# Patient Record
Sex: Female | Born: 1945 | Hispanic: No | Marital: Married | Smoking: Never smoker
Health system: Southern US, Community
[De-identification: ages and names within clinical notes are randomized; demographics above are authoritative.]

## PROBLEM LIST (undated history)

## (undated) DIAGNOSIS — E7800 Pure hypercholesterolemia, unspecified: Secondary | ICD-10-CM

## (undated) DIAGNOSIS — I10 Essential (primary) hypertension: Secondary | ICD-10-CM

## (undated) DIAGNOSIS — E119 Type 2 diabetes mellitus without complications: Secondary | ICD-10-CM

---

## 2008-05-08 ENCOUNTER — Ambulatory Visit (HOSPITAL_COMMUNITY): Admission: RE | Admit: 2008-05-08 | Discharge: 2008-05-08 | Payer: Self-pay | Admitting: Family Medicine

## 2010-05-15 ENCOUNTER — Ambulatory Visit (HOSPITAL_COMMUNITY): Admission: RE | Admit: 2010-05-15 | Discharge: 2010-05-15 | Payer: Self-pay | Admitting: Family Medicine

## 2010-08-29 ENCOUNTER — Encounter: Payer: Self-pay | Admitting: Family Medicine

## 2012-03-30 ENCOUNTER — Other Ambulatory Visit (HOSPITAL_COMMUNITY): Payer: Self-pay | Admitting: Family Medicine

## 2012-03-30 DIAGNOSIS — Z1231 Encounter for screening mammogram for malignant neoplasm of breast: Secondary | ICD-10-CM

## 2012-04-13 ENCOUNTER — Ambulatory Visit (HOSPITAL_COMMUNITY)
Admission: RE | Admit: 2012-04-13 | Discharge: 2012-04-13 | Disposition: A | Discharge status: Home / Self Care | Payer: BC Managed Care – PPO | Source: Ambulatory Visit | Attending: Family Medicine | Admitting: Family Medicine

## 2012-04-13 DIAGNOSIS — Z1231 Encounter for screening mammogram for malignant neoplasm of breast: Secondary | ICD-10-CM | POA: Insufficient documentation

## 2013-07-04 ENCOUNTER — Other Ambulatory Visit (HOSPITAL_COMMUNITY): Payer: Self-pay | Admitting: Family Medicine

## 2013-07-04 DIAGNOSIS — Z1231 Encounter for screening mammogram for malignant neoplasm of breast: Secondary | ICD-10-CM

## 2013-07-06 ENCOUNTER — Ambulatory Visit (HOSPITAL_COMMUNITY): Payer: BC Managed Care – PPO

## 2013-07-20 ENCOUNTER — Ambulatory Visit (HOSPITAL_COMMUNITY)
Admission: RE | Admit: 2013-07-20 | Discharge: 2013-07-20 | Disposition: A | Discharge status: Home / Self Care | Payer: No Typology Code available for payment source | Source: Ambulatory Visit | Attending: Family Medicine | Admitting: Family Medicine

## 2013-07-20 DIAGNOSIS — Z1231 Encounter for screening mammogram for malignant neoplasm of breast: Secondary | ICD-10-CM | POA: Insufficient documentation

## 2014-11-29 ENCOUNTER — Other Ambulatory Visit (HOSPITAL_COMMUNITY): Payer: Self-pay | Admitting: Family Medicine

## 2014-11-29 DIAGNOSIS — Z1231 Encounter for screening mammogram for malignant neoplasm of breast: Secondary | ICD-10-CM

## 2014-12-06 ENCOUNTER — Ambulatory Visit (HOSPITAL_COMMUNITY)
Admission: RE | Admit: 2014-12-06 | Discharge: 2014-12-06 | Disposition: A | Discharge status: Home / Self Care | Payer: No Typology Code available for payment source | Source: Ambulatory Visit | Attending: Family Medicine | Admitting: Family Medicine

## 2014-12-06 DIAGNOSIS — Z1231 Encounter for screening mammogram for malignant neoplasm of breast: Secondary | ICD-10-CM

## 2016-03-22 ENCOUNTER — Other Ambulatory Visit: Payer: Self-pay | Admitting: Family Medicine

## 2016-03-23 LAB — CMP12+LP+TP+TSH+6AC+CBC/D/PLT
A/G RATIO: 1.6 (ref 1.2–2.2)
ALBUMIN: 4.6 g/dL (ref 3.5–4.8)
ALK PHOS: 68 IU/L (ref 39–117)
ALT: 21 IU/L (ref 0–32)
AST: 20 IU/L (ref 0–40)
BASOS ABS: 0 10*3/uL (ref 0.0–0.2)
BASOS: 0 %
BILIRUBIN TOTAL: 0.4 mg/dL (ref 0.0–1.2)
BUN / CREAT RATIO: 40 — AB (ref 12–28)
BUN: 23 mg/dL (ref 8–27)
CHLORIDE: 98 mmol/L (ref 96–106)
CHOLESTEROL TOTAL: 149 mg/dL (ref 100–199)
Calcium: 9.8 mg/dL (ref 8.7–10.3)
Chol/HDL Ratio: 2.6 ratio units (ref 0.0–4.4)
Creatinine, Ser: 0.57 mg/dL (ref 0.57–1.00)
EOS (ABSOLUTE): 0.2 10*3/uL (ref 0.0–0.4)
EOS: 2 %
FREE THYROXINE INDEX: 1.7 (ref 1.2–4.9)
GFR calc non Af Amer: 94 mL/min/{1.73_m2} (ref >59)
GFR, EST AFRICAN AMERICAN: 109 mL/min/{1.73_m2} (ref >59)
GGT: 37 IU/L (ref 0–60)
GLUCOSE: 119 mg/dL — AB (ref 65–99)
Globulin, Total: 2.9 g/dL (ref 1.5–4.5)
HDL: 57 mg/dL (ref >39)
HEMATOCRIT: 42.9 % (ref 34.0–46.6)
HEMOGLOBIN: 14.1 g/dL (ref 11.1–15.9)
IMMATURE GRANS (ABS): 0 10*3/uL (ref 0.0–0.1)
IMMATURE GRANULOCYTES: 0 %
Iron: 87 ug/dL (ref 27–139)
LDH: 214 IU/L (ref 119–226)
LDL CALC: 62 mg/dL (ref 0–99)
LYMPHS: 26 %
Lymphocytes Absolute: 2.5 10*3/uL (ref 0.7–3.1)
MCH: 31.9 pg (ref 26.6–33.0)
MCHC: 32.9 g/dL (ref 31.5–35.7)
MCV: 97 fL (ref 79–97)
MONOCYTES: 7 %
Monocytes Absolute: 0.7 10*3/uL (ref 0.1–0.9)
NEUTROS PCT: 65 %
Neutrophils Absolute: 6.2 10*3/uL (ref 1.4–7.0)
PLATELETS: 252 10*3/uL (ref 150–379)
Phosphorus: 3.9 mg/dL (ref 2.5–4.5)
Potassium: 3.8 mmol/L (ref 3.5–5.2)
RBC: 4.42 x10E6/uL (ref 3.77–5.28)
RDW: 13.6 % (ref 12.3–15.4)
SODIUM: 142 mmol/L (ref 134–144)
T3 Uptake Ratio: 23 % — ABNORMAL LOW (ref 24–39)
T4, Total: 7.3 ug/dL (ref 4.5–12.0)
TSH: 1.52 u[IU]/mL (ref 0.450–4.500)
Total Protein: 7.5 g/dL (ref 6.0–8.5)
Triglycerides: 149 mg/dL (ref 0–149)
Uric Acid: 4.3 mg/dL (ref 2.5–7.1)
VLDL CHOLESTEROL CAL: 30 mg/dL (ref 5–40)
WBC: 9.6 10*3/uL (ref 3.4–10.8)

## 2016-03-23 LAB — HGB A1C W/O EAG: Hgb A1c MFr Bld: 6.6 % — ABNORMAL HIGH (ref 4.8–5.6)

## 2016-06-08 ENCOUNTER — Encounter: Payer: Self-pay | Admitting: *Deleted

## 2016-11-04 ENCOUNTER — Other Ambulatory Visit: Payer: Self-pay | Admitting: Family Medicine

## 2016-11-04 DIAGNOSIS — Z1231 Encounter for screening mammogram for malignant neoplasm of breast: Secondary | ICD-10-CM

## 2016-11-26 ENCOUNTER — Ambulatory Visit
Admission: RE | Admit: 2016-11-26 | Discharge: 2016-11-26 | Disposition: A | Discharge status: Home / Self Care | Payer: No Typology Code available for payment source | Source: Ambulatory Visit | Attending: Family Medicine | Admitting: Family Medicine

## 2016-11-26 DIAGNOSIS — Z1231 Encounter for screening mammogram for malignant neoplasm of breast: Secondary | ICD-10-CM

## 2017-04-29 ENCOUNTER — Ambulatory Visit: Payer: Self-pay | Admitting: *Deleted

## 2017-04-29 VITALS — BP 132/81 | Ht 62.0 in | Wt 170.0 lb

## 2017-04-29 DIAGNOSIS — Z0000 Encounter for general adult medical examination without abnormal findings: Secondary | ICD-10-CM

## 2017-04-29 NOTE — Progress Notes (Signed)
[employer] insurance premium discount evaluation: Labs Drawn. Replacements ROI form signed. Tobacco Free Attestation form signed.  Forms placed in paper chart.  Okay to route results to pcp per pt.   Pt requests I call a coworker on Monday to help translate for her and spouses' results as their daughter normally translates for them but works all day.

## 2017-04-30 LAB — CMP12+LP+TP+TSH+6AC+CBC/D/PLT
ALK PHOS: 79 IU/L (ref 39–117)
ALT: 30 IU/L (ref 0–32)
AST: 26 IU/L (ref 0–40)
Albumin/Globulin Ratio: 1.7 (ref 1.2–2.2)
Albumin: 4.6 g/dL (ref 3.5–4.8)
BASOS: 0 %
BUN/Creatinine Ratio: 34 — ABNORMAL HIGH (ref 12–28)
BUN: 22 mg/dL (ref 8–27)
Basophils Absolute: 0 10*3/uL (ref 0.0–0.2)
Bilirubin Total: 0.7 mg/dL (ref 0.0–1.2)
CALCIUM: 9.9 mg/dL (ref 8.7–10.3)
CHLORIDE: 100 mmol/L (ref 96–106)
CHOL/HDL RATIO: 2.9 ratio (ref 0.0–4.4)
CREATININE: 0.64 mg/dL (ref 0.57–1.00)
Cholesterol, Total: 135 mg/dL (ref 100–199)
EOS (ABSOLUTE): 0.3 10*3/uL (ref 0.0–0.4)
Eos: 4 %
Estimated CHD Risk: <0.5 times avg. (ref 0.0–1.0)
Free Thyroxine Index: 1.8 (ref 1.2–4.9)
GFR calc Af Amer: 104 mL/min/{1.73_m2} (ref >59)
GFR, EST NON AFRICAN AMERICAN: 90 mL/min/{1.73_m2} (ref >59)
GGT: 26 IU/L (ref 0–60)
GLOBULIN, TOTAL: 2.7 g/dL (ref 1.5–4.5)
GLUCOSE: 125 mg/dL — AB (ref 65–99)
HDL: 47 mg/dL (ref >39)
HEMATOCRIT: 42.1 % (ref 34.0–46.6)
Hemoglobin: 14.3 g/dL (ref 11.1–15.9)
IMMATURE GRANULOCYTES: 0 %
Immature Grans (Abs): 0 10*3/uL (ref 0.0–0.1)
Iron: 83 ug/dL (ref 27–139)
LDH: 204 IU/L (ref 119–226)
LDL CALC: 59 mg/dL (ref 0–99)
LYMPHS: 32 %
Lymphocytes Absolute: 2.4 10*3/uL (ref 0.7–3.1)
MCH: 32.6 pg (ref 26.6–33.0)
MCHC: 34 g/dL (ref 31.5–35.7)
MCV: 96 fL (ref 79–97)
MONOCYTES: 8 %
MONOS ABS: 0.6 10*3/uL (ref 0.1–0.9)
NEUTROS ABS: 4.3 10*3/uL (ref 1.4–7.0)
Neutrophils: 56 %
PHOSPHORUS: 3.8 mg/dL (ref 2.5–4.5)
POTASSIUM: 4.1 mmol/L (ref 3.5–5.2)
Platelets: 247 10*3/uL (ref 150–379)
RBC: 4.39 x10E6/uL (ref 3.77–5.28)
RDW: 13.5 % (ref 12.3–15.4)
SODIUM: 140 mmol/L (ref 134–144)
T3 Uptake Ratio: 24 % (ref 24–39)
T4, Total: 7.3 ug/dL (ref 4.5–12.0)
TRIGLYCERIDES: 147 mg/dL (ref 0–149)
TSH: 1.66 u[IU]/mL (ref 0.450–4.500)
Total Protein: 7.3 g/dL (ref 6.0–8.5)
URIC ACID: 7 mg/dL (ref 2.5–7.1)
VLDL Cholesterol Cal: 29 mg/dL (ref 5–40)
WBC: 7.7 10*3/uL (ref 3.4–10.8)

## 2017-04-30 LAB — HGB A1C W/O EAG: HEMOGLOBIN A1C: 6.5 % — AB (ref 4.8–5.6)

## 2017-05-09 NOTE — Progress Notes (Signed)
Results reviewed with pt. Glucose and A1c elevated, similar to previous. Diet and exercise recommendations for wt management (BMI 31) and A1c discussed. Routine f/u with pcp. Copy of labs provided to pt. Results routed to pcp per pt request. No further questions concerns.

## 2017-10-12 ENCOUNTER — Other Ambulatory Visit: Payer: Self-pay | Admitting: Family Medicine

## 2017-10-12 DIAGNOSIS — M810 Age-related osteoporosis without current pathological fracture: Secondary | ICD-10-CM

## 2018-01-19 ENCOUNTER — Other Ambulatory Visit: Payer: Self-pay | Admitting: Family Medicine

## 2018-01-19 DIAGNOSIS — E2839 Other primary ovarian failure: Secondary | ICD-10-CM

## 2018-02-24 ENCOUNTER — Other Ambulatory Visit: Payer: Self-pay | Admitting: Family Medicine

## 2018-02-24 DIAGNOSIS — Z1231 Encounter for screening mammogram for malignant neoplasm of breast: Secondary | ICD-10-CM

## 2018-04-11 ENCOUNTER — Ambulatory Visit
Admission: RE | Admit: 2018-04-11 | Discharge: 2018-04-11 | Disposition: A | Discharge status: Home / Self Care | Payer: No Typology Code available for payment source | Source: Ambulatory Visit | Attending: Family Medicine | Admitting: Family Medicine

## 2018-04-11 ENCOUNTER — Other Ambulatory Visit: Payer: No Typology Code available for payment source

## 2018-04-11 DIAGNOSIS — Z1231 Encounter for screening mammogram for malignant neoplasm of breast: Secondary | ICD-10-CM

## 2018-06-13 ENCOUNTER — Ambulatory Visit
Admission: RE | Admit: 2018-06-13 | Discharge: 2018-06-13 | Disposition: A | Discharge status: Home / Self Care | Payer: No Typology Code available for payment source | Source: Ambulatory Visit | Attending: Family Medicine | Admitting: Family Medicine

## 2018-06-13 DIAGNOSIS — E2839 Other primary ovarian failure: Secondary | ICD-10-CM

## 2018-08-26 ENCOUNTER — Encounter (HOSPITAL_BASED_OUTPATIENT_CLINIC_OR_DEPARTMENT_OTHER): Payer: Self-pay | Admitting: Emergency Medicine

## 2018-08-26 ENCOUNTER — Other Ambulatory Visit: Payer: Self-pay

## 2018-08-26 ENCOUNTER — Emergency Department (HOSPITAL_BASED_OUTPATIENT_CLINIC_OR_DEPARTMENT_OTHER)
Admission: EM | Admit: 2018-08-26 | Discharge: 2018-08-26 | Disposition: A | Discharge status: Home / Self Care | Payer: No Typology Code available for payment source | Attending: Emergency Medicine | Admitting: Emergency Medicine

## 2018-08-26 DIAGNOSIS — I10 Essential (primary) hypertension: Secondary | ICD-10-CM | POA: Diagnosis not present

## 2018-08-26 DIAGNOSIS — Z7984 Long term (current) use of oral hypoglycemic drugs: Secondary | ICD-10-CM | POA: Diagnosis not present

## 2018-08-26 DIAGNOSIS — E119 Type 2 diabetes mellitus without complications: Secondary | ICD-10-CM | POA: Diagnosis not present

## 2018-08-26 DIAGNOSIS — T7840XA Allergy, unspecified, initial encounter: Secondary | ICD-10-CM | POA: Insufficient documentation

## 2018-08-26 DIAGNOSIS — R220 Localized swelling, mass and lump, head: Secondary | ICD-10-CM | POA: Diagnosis present

## 2018-08-26 HISTORY — DX: Essential (primary) hypertension: I10

## 2018-08-26 HISTORY — DX: Type 2 diabetes mellitus without complications: E11.9

## 2018-08-26 HISTORY — DX: Pure hypercholesterolemia, unspecified: E78.00

## 2018-08-26 MED ORDER — DIPHENHYDRAMINE HCL 50 MG/ML IJ SOLN
25.0000 mg | Freq: Once | INTRAMUSCULAR | Status: AC
  Administered 2018-08-26: 25 mg via INTRAVENOUS
  Filled 2018-08-26: qty 1

## 2018-08-26 MED ORDER — SODIUM CHLORIDE 0.9 % IV SOLN
INTRAVENOUS | Status: DC | PRN
Start: 2018-08-26 — End: 2018-08-27
  Administered 2018-08-26: 250 mL via INTRAVENOUS

## 2018-08-26 MED ORDER — EPINEPHRINE 0.3 MG/0.3ML IJ SOAJ: 0.3000 mg | Freq: Once | INTRAMUSCULAR | 1 refills | Status: AC

## 2018-08-26 MED ORDER — PREDNISONE 20 MG PO TABS: 40.0000 mg | ORAL_TABLET | Freq: Every day | ORAL | 0 refills | Status: AC

## 2018-08-26 MED ORDER — FAMOTIDINE 20 MG PO TABS: 20.0000 mg | ORAL_TABLET | Freq: Two times a day (BID) | ORAL | 0 refills | Status: AC

## 2018-08-26 MED ORDER — FAMOTIDINE IN NACL 20-0.9 MG/50ML-% IV SOLN
20.0000 mg | Freq: Once | INTRAVENOUS | Status: AC
  Administered 2018-08-26: 20 mg via INTRAVENOUS
  Filled 2018-08-26: qty 50

## 2018-08-26 MED ORDER — DIPHENHYDRAMINE HCL 25 MG PO TABS: 25.0000 mg | ORAL_TABLET | Freq: Four times a day (QID) | ORAL | 0 refills | Status: AC | PRN

## 2018-08-26 MED ORDER — METHYLPREDNISOLONE SODIUM SUCC 125 MG IJ SOLR
125.0000 mg | Freq: Once | INTRAMUSCULAR | Status: AC
  Administered 2018-08-26: 125 mg via INTRAVENOUS
  Filled 2018-08-26: qty 2

## 2018-08-26 NOTE — ED Notes (Signed)
Rx x 4 given. Pt d/c home with daughter. D/c instructions reviewed with audio interpreter [phone]

## 2018-08-26 NOTE — ED Triage Notes (Signed)
Problem with with food allergies, the patient states that she ate coleslaw for lunch and since she has had trouble with her throat and her eyes are itching. She also had throwing up after she ate the coleslaw. Patient is is not noted distress at this time

## 2018-08-26 NOTE — Discharge Instructions (Signed)
Please read and follow all provided instructions.  Your diagnoses today include:  1. Allergic reaction, initial encounter     Tests performed today include:  Vital signs. See below for your results today.   Medications prescribed:   Prednisone - steroid medicine   It is best to take this medication in the morning to prevent sleeping problems. If you are diabetic, monitor your blood sugar closely and stop taking Prednisone if blood sugar is over 300. Take with food to prevent stomach upset.    Benadryl (diphenhydramine) - antihistamine  You can find this medication over-the-counter.   DO NOT exceed:   50mg  Benadryl every 6 hours    Benadryl will make you drowsy. DO NOT drive or perform any activities that require you to be awake and alert if taking this.   Pepcid (famotidine) - antihistamine  You can find this medication over-the-counter.   DO NOT exceed:   20mg  Pepcid every 12 hours   Epi-pen  Inject into thigh as directed if you have a severe reaction that causes throat swelling or any trouble breathing. Call 9-1-1 immediately if you use an Epi-pen. You should be evaluated at a hospital as soon as possible.    Take any prescribed medications only as directed.  Home care instructions:   Follow any educational materials contained in this packet  Follow-up instructions: Please follow-up with your primary care provider in the next 3 days for further evaluation of your symptoms.   Return instructions:   Please return to the Emergency Department if you experience worsening symptoms.   Call 9-1-1 immediately if you have an allergic reaction that involves your lips, mouth, throat or if you have any difficulty breathing. This is a life-threatening emergency.   Please return if you have any other emergent concerns.  Additional Information:  Your vital signs today were: BP 134/66    Pulse 71    Temp 97.7 F (36.5 C) (Oral)    Resp 16    Ht 5' (1.524 m)    Wt 78.9  kg    SpO2 98%    BMI 33.98 kg/m  If your blood pressure (BP) was elevated above 135/85 this visit, please have this repeated by your doctor within one month. --------------

## 2018-08-26 NOTE — ED Provider Notes (Addendum)
[facility] EMERGENCY DEPARTMENT Provider Note   CSN: [id number] Arrival date & time: 08/26/18  1809     History   Chief Complaint No chief complaint on file.   HPI Abigail Barnes is a 73 y.o. female.  Patient with history of diabetes presents the emergency department with complaint of allergic reaction.  History obtained via daughter who interprets as well as [place] interpreter.  Patient had coleslaw at approximately 3 PM.  Around 3:30pm she developed swelling in her face, itchy hands, and difficulty swallowing.  Patient had an episode of vomiting.  No diarrhea or abdominal pain.  Later she took some Benadryl.  Symptoms have stabilized however have not resolved.  She has not had any difficulty breathing.  She denies previous food or medication allergies.  No history of similar reactions in the past. The onset of this condition was acute. The course is constant. Aggravating factors: none. Alleviating factors: none.       Past Medical History:  Diagnosis Date  . Diabetes mellitus without complication (HCC)   . Hypercholesterolemia   . Hypertension     There are no active problems to display for this patient.   History reviewed. No pertinent surgical history.   OB History   No obstetric history on file.      Home Medications    Prior to Admission medications   Medication Sig Start Date End Date Taking? Authorizing Provider  amlodipine-atorvastatin (CADUET) 10-20 MG tablet TAKE 1 TABLET BY MOUTH DAILY 02/23/17   [provider]  aspirin 81 MG chewable tablet Chew 81 mg by mouth. 03/16/13   [provider]  losartan-hydrochlorothiazide (HYZAAR) 100-12.5 MG tablet TAKE 1 TABLET BY MOUTH EVERY DAY 04/05/17   [provider]  metFORMIN (GLUCOPHAGE-XR) 500 MG 24 hr tablet TAKE 1 TABLET(500 MG) BY MOUTH DAILY 07/29/16   [provider]    Family History History reviewed. No pertinent family history.  Social History Social History    Tobacco Use  . Smoking status: Never Smoker  . Smokeless tobacco: Never Used  Substance Use Topics  . Alcohol use: Never    Frequency: Never  . Drug use: Never     Allergies   Patient has no known allergies.   Review of Systems Review of Systems  Constitutional: Negative for fever.  HENT: Positive for facial swelling and trouble swallowing.   Eyes: Negative for redness.  Respiratory: Negative for shortness of breath, wheezing and stridor.   Cardiovascular: Negative for chest pain.  Gastrointestinal: Negative for nausea and vomiting.  Musculoskeletal: Negative for myalgias.  Skin: Positive for rash.  Neurological: Negative for light-headedness.  Psychiatric/Behavioral: Negative for confusion.     Physical Exam Updated Vital Signs BP (!) 147/70 (BP Location: Left Arm)   Pulse 77   Temp 97.7 F (36.5 C) (Oral)   Resp 16   Ht 5' (1.524 m)   Wt 78.9 kg   SpO2 98%   BMI 33.98 kg/m   Physical Exam Vitals signs and nursing note reviewed.  Constitutional:      Appearance: She is well-developed.  HENT:     Head: Normocephalic and atraumatic.     Mouth/Throat:     Comments: No lip or tongue swelling.  Mild posterior pharyngeal edema.  Patient in no respiratory distress.  No stridor. Eyes:     General:        Right eye: No discharge.        Left eye: No discharge.  Conjunctiva/sclera: Conjunctivae normal.  Neck:     Musculoskeletal: Normal range of motion and neck supple.  Cardiovascular:     Rate and Rhythm: Normal rate and regular rhythm.     Heart sounds: Normal heart sounds.  Pulmonary:     Effort: Pulmonary effort is normal.     Breath sounds: Normal breath sounds. No stridor. No wheezing or rales.     Comments: Lung sounds are clear, no respiratory distress. Abdominal:     Palpations: Abdomen is soft.     Tenderness: There is no abdominal tenderness.  Skin:    General: Skin is warm and dry.  Neurological:     Mental Status: She is alert.       ED Treatments / Results  Labs (all labs ordered are listed, but only abnormal results are displayed) Labs Reviewed - No data to display  EKG None  Radiology No results found.  Procedures Procedures (including critical care time)  Medications Ordered in ED Medications  0.9 %  sodium chloride infusion ( Intravenous Stopped 08/26/18 2107)  methylPREDNISolone sodium succinate (SOLU-MEDROL) 125 mg/2 mL injection 125 mg (125 mg Intravenous Given 08/26/18 1910)  famotidine (PEPCID) IVPB 20 mg premix (0 mg Intravenous Stopped 08/26/18 2048)  diphenhydrAMINE (BENADRYL) injection 25 mg (25 mg Intravenous Given 08/26/18 1911)     Initial Impression / Assessment and Plan / ED Course  I have reviewed the triage vital signs and the nursing notes.  Pertinent labs & imaging results that were available during my care of the patient were reviewed by me and considered in my medical decision making (see chart for details).     Patient seen and examined. Work-up initiated. Medications ordered. Pt appears well. No indication for epi now, but will monitor.   Vital signs reviewed and are as follows: BP (!) 147/70 (BP Location: Left Arm)   Pulse 77   Temp 97.7 F (36.5 C) (Oral)   Resp 16   Ht 5' (1.524 m)   Wt 78.9 kg   SpO2 98%   BMI 33.98 kg/m   9:45 PM patient stable during ED stay.  Patient discussed with and seen by Dr. Anitra LauthPlunkett.  We will discharged home with prednisone, Benadryl, Pepcid.  Also given a prescription for an EpiPen.  Discussed its use with family at bedside.  Encouraged PCP follow-up for recheck, further evaluation as needed.  Encouraged return to the emergency department with worsening symptoms, shortness of breath, trouble breathing, persistent vomiting, passing out or other concerns.  Final Clinical Impressions(s) / ED Diagnoses   Final diagnoses:  Allergic reaction, initial encounter   Patient with likely mild anaphylactic reaction today due to ingestion  of food.  No new medications.  Patient is on arb, however symptoms more generalized than what I would expect with this medication.  Upon arrival to the emergency department, symptoms improving.  She was treated with IV Solu-Medrol, Pepcid, Benadryl with continued improvement.  Patient stable and ready for discharged home at this time.  Home with prescriptions as above.  Also with EpiPen.  ED Discharge Orders         Ordered    predniSONE (DELTASONE) 20 MG tablet  Daily     08/26/18 2045    diphenhydrAMINE (BENADRYL) 25 MG tablet  Every 6 hours PRN     08/26/18 2045    famotidine (PEPCID) 20 MG tablet  2 times daily     08/26/18 2045    EPINEPHrine 0.3 mg/0.3 mL IJ SOAJ injection  Once     08/26/18 2045            Renne Crigler, PA-C 08/26/18 2147    Gwyneth Sprout, MD 08/26/18 2248

## 2018-09-21 ENCOUNTER — Ambulatory Visit: Payer: Self-pay | Admitting: *Deleted

## 2019-08-28 IMAGING — MG DIGITAL SCREENING BILATERAL MAMMOGRAM WITH TOMO AND CAD
8 series · 8 of 24 positions shown · non-contrast
Comparison: Previous exam(s).

CLINICAL DATA: Screening.

EXAM:
DIGITAL SCREENING BILATERAL MAMMOGRAM WITH TOMO AND CAD

[R CC synth-2D]
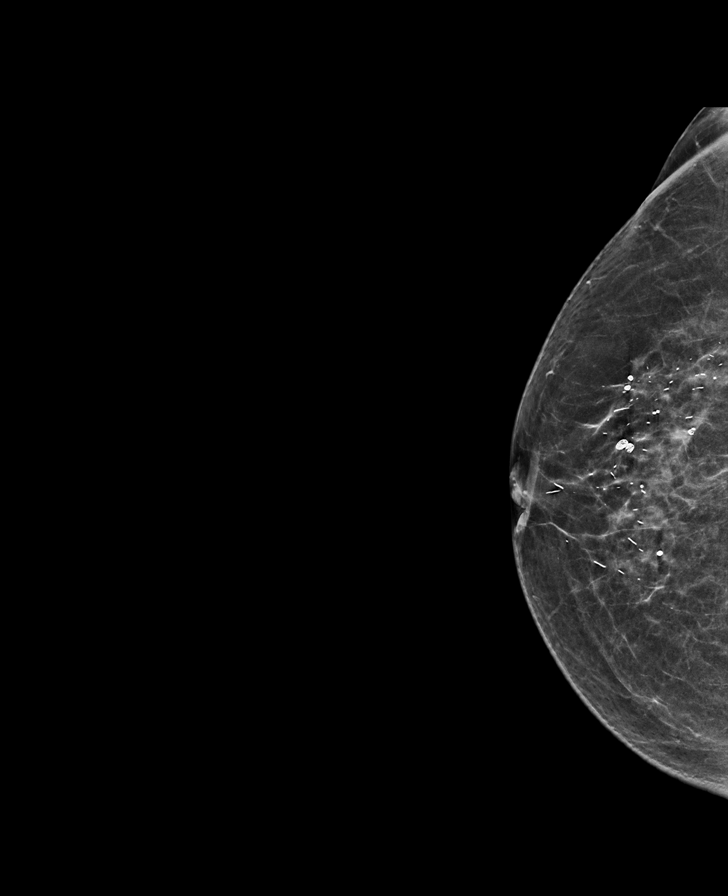

[R MLO synth-2D]
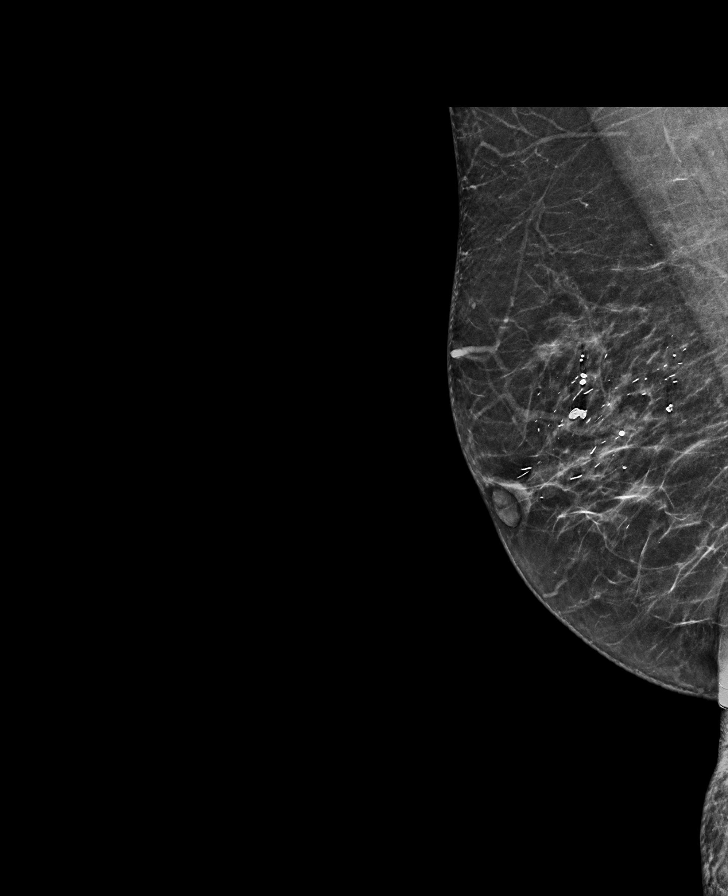

[L CC synth-2D]
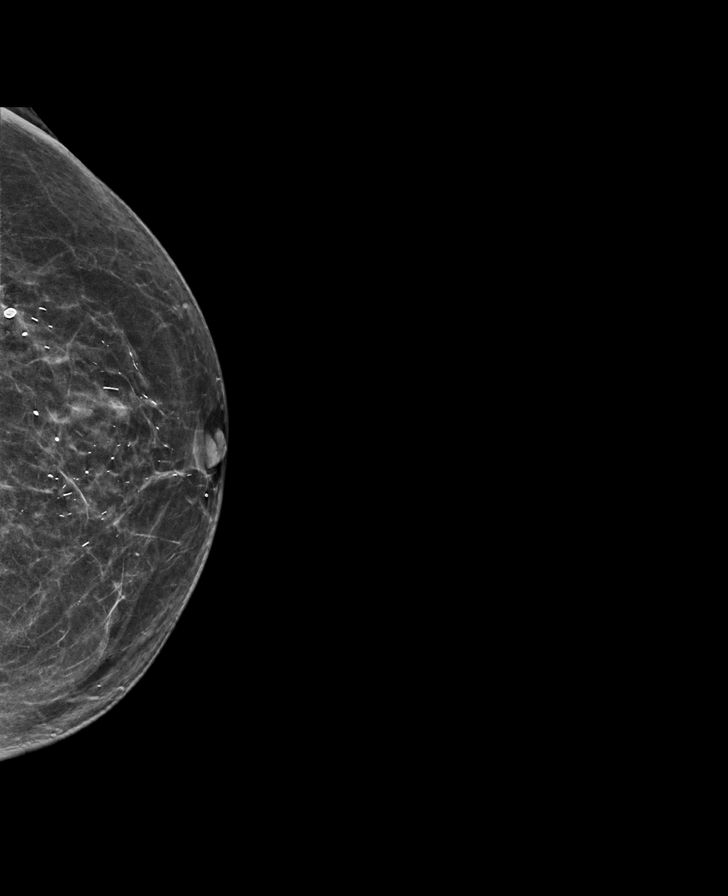

[L MLO synth-2D]
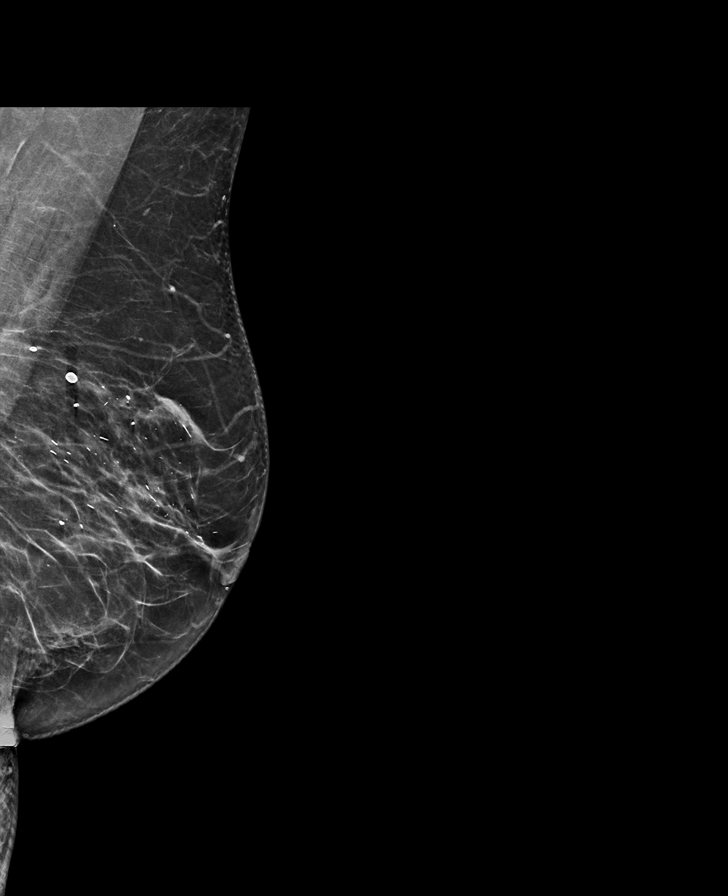

[R CC tomo · tomo slice 31/60.0]
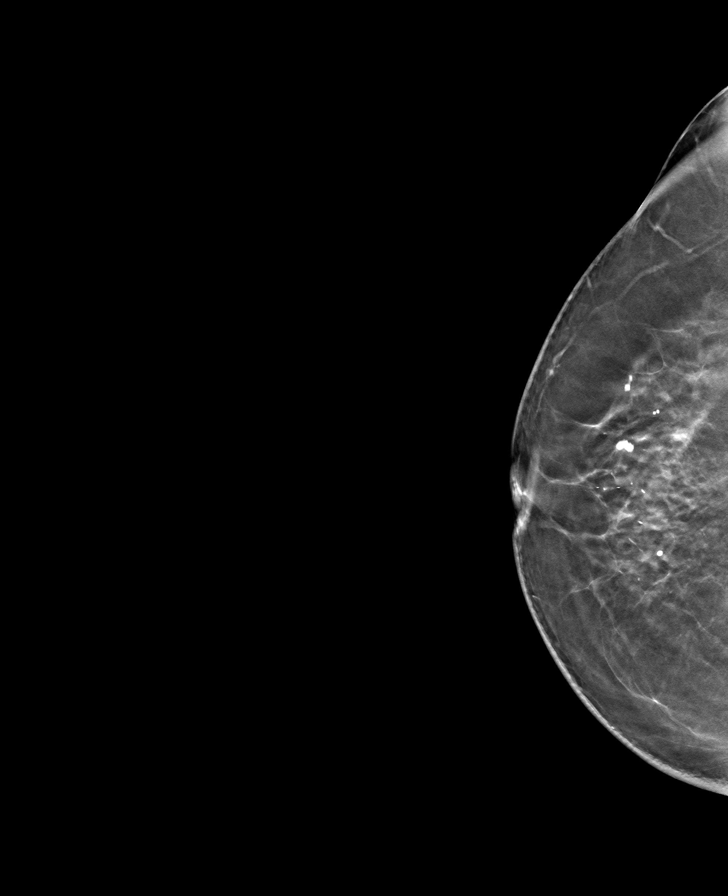

[L MLO tomo · tomo slice 33/65.0]
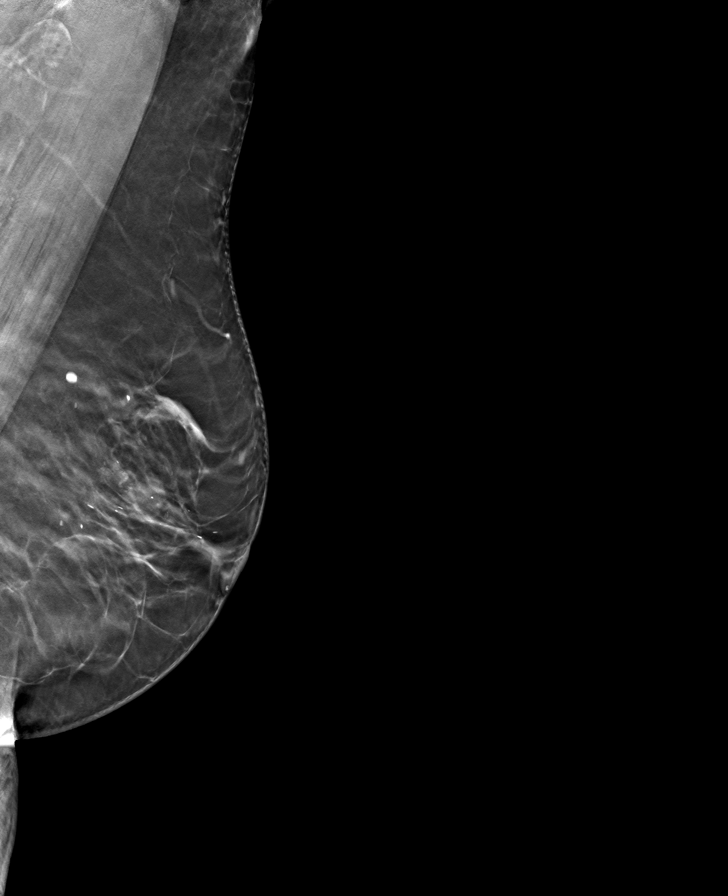

[R MLO tomo · tomo slice 30/59.0]
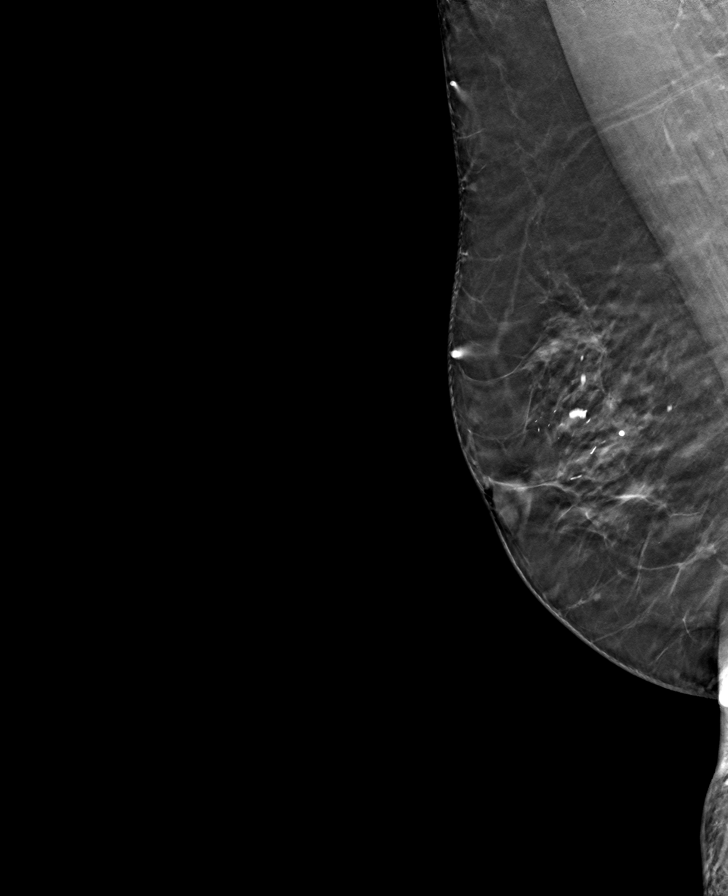

[L CC tomo · tomo slice 31/61.0]
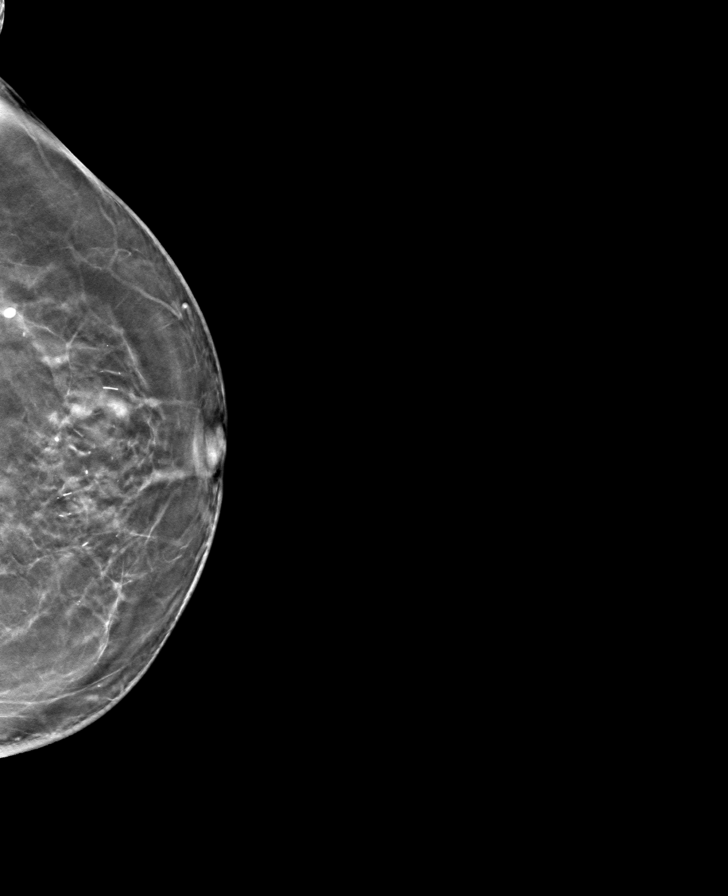

[8 of 24 positions shown; findings below may reference images not displayed]

ACR Breast Density Category b: There are scattered areas of
fibroglandular density.
FINDINGS: There are no findings suspicious for malignancy. Images were
processed with CAD.
IMPRESSION: No mammographic evidence of malignancy. A result letter of this
screening mammogram will be mailed directly to the patient.

RECOMMENDATION:
Screening mammogram in one year. (Code:[id number])

BI-RADS CATEGORY  1: Negative.
